# Patient Record
Sex: Male | Born: 1995 | Race: Asian | Hispanic: No | Marital: Single
Health system: Midwestern US, Community
[De-identification: ages and names within clinical notes are randomized; demographics above are authoritative.]

## PROBLEM LIST (undated history)

## (undated) DIAGNOSIS — Z0000 Encounter for general adult medical examination without abnormal findings: Secondary | ICD-10-CM

---
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
## 2014-03-19 ENCOUNTER — Emergency Department (HOSPITAL_COMMUNITY)
Admission: EM | Admit: 2014-03-19 | Discharge: 2014-03-19 | Discharge status: Absent without leave | Payer: Medicaid Other | Source: Home / Self Care

## 2014-03-19 ENCOUNTER — Emergency Department (HOSPITAL_COMMUNITY)
Admission: EM | Admit: 2014-03-19 | Discharge: 2014-03-19 | Disposition: A | Discharge status: Home / Self Care | Payer: Medicaid Other | Attending: Emergency Medicine | Admitting: Emergency Medicine

## 2014-03-19 ENCOUNTER — Encounter (HOSPITAL_COMMUNITY): Payer: Self-pay | Admitting: Emergency Medicine

## 2014-03-19 DIAGNOSIS — N342 Other urethritis: Secondary | ICD-10-CM | POA: Insufficient documentation

## 2014-03-19 LAB — URINALYSIS, ROUTINE W REFLEX MICROSCOPIC
Bilirubin Urine: NEGATIVE
Glucose, UA: NEGATIVE mg/dL
Hgb urine dipstick: NEGATIVE
KETONES UR: NEGATIVE mg/dL
LEUKOCYTES UA: NEGATIVE
NITRITE: NEGATIVE
Protein, ur: NEGATIVE mg/dL
SPECIFIC GRAVITY, URINE: 1.023 (ref 1.005–1.030)
UROBILINOGEN UA: 1 mg/dL (ref 0.0–1.0)
pH: 6.5 (ref 5.0–8.0)

## 2014-03-19 MED ORDER — TERBINAFINE HCL 1 % EX CREA: 1.0000 "application " | TOPICAL_CREAM | Freq: Two times a day (BID) | CUTANEOUS | Status: AC

## 2014-03-19 MED ORDER — CEFTRIAXONE SODIUM 1 G IJ SOLR
1.0000 g | Freq: Once | INTRAMUSCULAR | Status: AC
  Administered 2014-03-19: 1 g via INTRAMUSCULAR
  Filled 2014-03-19: qty 10

## 2014-03-19 MED ORDER — AZITHROMYCIN 250 MG PO TABS
1000.0000 mg | ORAL_TABLET | Freq: Once | ORAL | Status: AC
  Administered 2014-03-19: 1000 mg via ORAL
  Filled 2014-03-19: qty 4

## 2014-03-19 NOTE — Discharge Instructions (Signed)
Urethritis, Adult  Urethritis is an inflammation of the tube through which urine exits your bladder (urethra).   CAUSES  Urethritis is often caused by an infection in your urethra. The infection can be viral, like herpes. The infection can also be bacterial, like gonorrhea.  RISK FACTORS  Risk factors of urethritis include:  · Having sex without using a condom.  · Having multiple sexual partners.  · Having poor hygiene.  SIGNS AND SYMPTOMS  Symptoms of urethritis are less noticeable in women than in men. These symptoms include:  · Burning feeling when you urinate (dysuria).  · Discharge from your urethra.  · Blood in your urine (hematuria).  · Urinating more than usual.  DIAGNOSIS   To confirm a diagnosis of urethritis, your health care provider will do the following:  · Ask about your sexual history.  · Perform a physical exam.  · Have you provide a sample of your urine for lab testing.  · Use a cotton swab to gently collect a sample from your urethra for lab testing.  TREATMENT   It is important to treat urethritis. Depending on the cause, untreated urethritis may lead to serious genital infections and possibly infertility. Urethritis caused by a bacterial infection is treated with antibiotics. All sexual partners must be treated.   HOME CARE INSTRUCTIONS  · Do not have sex until the test results are known and treatment is completed, even if your symptoms go away before you finish treatment.  · Finish all medicines that you are prescribed.  SEEK MEDICAL CARE IF:   · Your symptoms are not improved in 3 days.  · Your symptoms are getting worse.  · You develop abdominal pain or pelvic pain (in women).  · You develop joint pain.  SEEK IMMEDIATE MEDICAL CARE IF:   · You have a fever with a temperature of 101.8°F (38.8°C) or greater.  · You have severe pain in the belly, back, or side.  · You have repeated vomiting.  Document Released: 05/24/2001 Document Revised: 09/18/2013 Document Reviewed: 07/29/2013  ExitCare®  Patient Information ©2014 [employer].

## 2014-03-19 NOTE — ED Notes (Signed)
Per pt and family sts about 2 years ago he was treated for urinary frequency with some medication and now it is back. sts has been going on for 1 month. Denies any pain. Denies N,V,D.

## 2014-03-19 NOTE — ED Notes (Signed)
CBG 123 

## 2014-03-19 NOTE — ED Provider Notes (Signed)
CSN: [id number]     Arrival date & time 03/19/14  1541 History  This chart was scribed for non-physician practitioner, Arthor CaptainAbigail Seeley Southgate, PA-C, working with Toy BakerAnthony T Allen, MD by Charline BillsEssence Howell, ED Scribe. This patient was seen in room TR05C/TR05C and the patient's care was started at 4:32 PM.    Chief Complaint  Patient presents with  . Urinary Frequency    The history is provided by the patient. No language interpreter was used.   HPI Comments: Nicholas Brown is a 18 y.o. male who presents to the Emergency Department complaining of constant urinary frequency onset 2 years ago. Pt states that the urinary frequency returned approximately 1 month ago. Pt states that he has the urge to urinate approximately every 2-3 minutes with small quantities. Pt also reports loss of appetite onset 3 days ago. Pt denies dysuria, penile discharge, and hematuria. He also denies any back and abdominal pain. Pt also denies fever, chills and vomit. Pt denies sexual intercourse. Pt's has no family history of kidney stones. Pt's father was diagnosed with diabetes 3 months ago.   History reviewed. No pertinent past medical history. History reviewed. No pertinent past surgical history. History reviewed. No pertinent family history. History  Substance Use Topics  . Smoking status: Never Smoker   . Smokeless tobacco: Not on file  . Alcohol Use: No    Review of Systems  Constitutional: Positive for appetite change (decreased). Negative for fever and chills.  Gastrointestinal: Negative for vomiting and abdominal pain.  Genitourinary: Positive for urgency and frequency. Negative for dysuria, hematuria and discharge.  Musculoskeletal: Negative for back pain.  All other systems reviewed and are negative.   Allergies  Review of patient's allergies indicates no known allergies.  Home Medications  No current outpatient prescriptions on file. Triage Vitals: BP 137/80  Pulse 92  Temp(Src) 98 F (36.7 C)  Resp 18  Wt  124 lb (56.246 kg)  SpO2 100% Physical Exam  Vitals reviewed. Constitutional: He is oriented to person, place, and time. He appears well-developed and well-nourished.  HENT:  Head: Normocephalic and atraumatic.  Eyes: EOM are normal. Pupils are equal, round, and reactive to light.  Neck: No JVD present. Carotid bruit is not present.  Cardiovascular: Normal rate, regular rhythm and normal heart sounds.   No murmur heard. Pulmonary/Chest: Effort normal and breath sounds normal. He has no rales.  Abdominal: Soft. There is no tenderness.  No CVA tenderness  Genitourinary: Uncircumcised. Penile erythema present.  Irritation and erythema around urethral meatus Tender to paplation No inguinal apathy, lesions or discharge Foreskin is easily retractable  Musculoskeletal: He exhibits no edema.  Neurological: He is alert and oriented to person, place, and time.  Skin: Skin is warm and dry.  Psychiatric: He has a normal mood and affect.    ED Course  Procedures (including critical care time) DIAGNOSTIC STUDIES: Oxygen Saturation is 100% on RA, normal by my interpretation.    COORDINATION OF CARE: 4:41 PM-Discussed treatment plan which includes UA with pt at bedside and pt agreed to plan.   Labs Review Labs Reviewed  GC/CHLAMYDIA PROBE AMP  URINALYSIS, ROUTINE W REFLEX MICROSCOPIC  CBG MONITORING, ED   Imaging Review No results found.   EKG Interpretation None      MDM   Final diagnoses:  Urethritis    Patient with urethritis. Will trat for g/c chlamydia. ? Yeast infection or other urethral irritation.  Advise topical lamisil.  F/u with pcp if unresolved. Marland Kitchen. UA unremarkable glucose  elevated.\\   I personally performed the services described in this documentation, which was scribed in my presence. The recorded information has been reviewed and is accurate.    Arthor Captain, PA-C 03/25/14 2257

## 2014-03-20 LAB — GC/CHLAMYDIA PROBE AMP
CT Probe RNA: NEGATIVE
GC PROBE AMP APTIMA: NEGATIVE

## 2014-03-21 LAB — CBG MONITORING, ED: GLUCOSE-CAPILLARY: 123 mg/dL — AB (ref 70–99)

## 2014-03-28 NOTE — ED Provider Notes (Signed)
Medical screening examination/treatment/procedure(s) were performed by non-physician practitioner and as supervising physician I was immediately available for consultation/collaboration.   EKG Interpretation None       Toy BakerAnthony T Kylena Mole, MD 03/28/14 1818

## 2014-11-14 ENCOUNTER — Emergency Department (HOSPITAL_COMMUNITY)
Admission: EM | Admit: 2014-11-14 | Discharge: 2014-11-14 | Disposition: A | Discharge status: Home / Self Care | Payer: Medicaid Other | Attending: Emergency Medicine | Admitting: Emergency Medicine

## 2014-11-14 ENCOUNTER — Encounter (HOSPITAL_COMMUNITY): Payer: Self-pay | Admitting: Emergency Medicine

## 2014-11-14 DIAGNOSIS — J029 Acute pharyngitis, unspecified: Secondary | ICD-10-CM | POA: Diagnosis not present

## 2014-11-14 DIAGNOSIS — Z79899 Other long term (current) drug therapy: Secondary | ICD-10-CM | POA: Diagnosis not present

## 2014-11-14 DIAGNOSIS — R509 Fever, unspecified: Secondary | ICD-10-CM | POA: Diagnosis present

## 2014-11-14 LAB — RAPID STREP SCREEN (MED CTR MEBANE ONLY): Streptococcus, Group A Screen (Direct): NEGATIVE

## 2014-11-14 MED ORDER — IBUPROFEN 400 MG PO TABS
600.0000 mg | ORAL_TABLET | Freq: Once | ORAL | Status: AC
  Administered 2014-11-14: 600 mg via ORAL

## 2014-11-14 MED ORDER — IBUPROFEN 400 MG PO TABS: 400.0000 mg | ORAL_TABLET | Freq: Once | ORAL | Status: DC

## 2014-11-14 NOTE — ED Notes (Signed)
Pt here by self. Pt has had sore throat and fever x2 days. No meds PTA.

## 2014-11-14 NOTE — Discharge Instructions (Signed)

## 2014-11-14 NOTE — ED Provider Notes (Signed)
CSN: [id number]     Arrival date & time 11/14/14  1341 History   First MD Initiated Contact with Patient 11/14/14 1352     Chief Complaint  Patient presents with  . Sore Throat  . Fever   18 yo male presents with 2 days of subjective fever, sore throat, and headache.  Did not take his temperature but reports he has felt warm.  Also with some mild cough.  No vomiting or diarrhea.  Mom gave him some pain pill for headache yesterday but he does not no which.  He denies any medical problems or drug allergies.  Works at the [employer].  (Consider location/radiation/quality/duration/timing/severity/associated sxs/prior Treatment) The history is provided by the patient.    No past medical history on file. History reviewed. No pertinent past surgical history. No family history on file. History  Substance Use Topics  . Smoking status: Never Smoker   . Smokeless tobacco: Not on file  . Alcohol Use: No    Review of Systems  Constitutional: Positive for fever. Negative for activity change and appetite change.  HENT: Positive for sore throat. Negative for congestion.   Respiratory: Positive for cough. Negative for shortness of breath and wheezing.   Cardiovascular: Negative for chest pain.  Gastrointestinal: Negative for nausea, vomiting and diarrhea.  Skin: Negative for rash.  Neurological: Positive for headaches. Negative for dizziness.  All other systems reviewed and are negative.     Allergies  Review of patient's allergies indicates no known allergies.  Home Medications   Prior to Admission medications   Medication Sig Start Date End Date Taking? Authorizing Provider  terbinafine (LAMISIL AT) 1 % cream Apply 1 application topically 2 (two) times daily. Apply to the penis 2 times a day 03/19/14   Arthor CaptainAbigail Harris, PA-C   BP 132/78 mmHg  Pulse 99  Temp(Src) 98.1 F (36.7 C) (Oral)  Resp 18  Wt 124 lb 12.5 oz (56.6 kg)  SpO2 100% Physical Exam  Constitutional: He is  oriented to person, place, and time. He appears well-developed and well-nourished.  HENT:  Head: Normocephalic and atraumatic.  Nose: Nose normal.  Mouth/Throat: Oropharynx is clear and moist. No oropharyngeal exudate.  Mildly erythematous oropharynx  Eyes: Conjunctivae and EOM are normal. Pupils are equal, round, and reactive to light. Right eye exhibits no discharge. Left eye exhibits no discharge.  Neck: Normal range of motion. Neck supple.  Cardiovascular: Normal rate, regular rhythm and normal heart sounds.  Exam reveals no gallop and no friction rub.   No murmur heard. Pulmonary/Chest: Effort normal and breath sounds normal. No respiratory distress. He has no wheezes.  Abdominal: Soft. He exhibits no distension. There is no tenderness.  Musculoskeletal: Normal range of motion. He exhibits no edema or tenderness.  Lymphadenopathy:    He has no cervical adenopathy.  Neurological: He is alert and oriented to person, place, and time.  Skin: Skin is warm. No rash noted.  Psychiatric: He has a normal mood and affect.    ED Course  Procedures (including critical care time) Labs Review Labs Reviewed  RAPID STREP SCREEN  CULTURE, GROUP A STREP    Imaging Review No results found.   EKG Interpretation None      MDM   Final diagnoses:  Pharyngitis    18 yo male with sore throat, subjective fever, and headache.  Afebrile on arrival and well appearing without meningeal signs.  Rapid strep negative.  D/C home with instructions for ibuprofen prn for sore throat/headache and  salt water gargles.  Saverio DankerSarah E. Rayven Rettig. MD PGY-3 [facility] 11/14/2014 2:46 PM      Saverio DankerSarah E Jamilya Sarrazin, MD 11/14/14 1446  Enid SkeensJoshua M Zavitz, MD 11/14/14 [phone]

## 2014-11-16 LAB — CULTURE, GROUP A STREP

## 2015-01-16 ENCOUNTER — Emergency Department (HOSPITAL_COMMUNITY)
Admission: EM | Admit: 2015-01-16 | Discharge: 2015-01-16 | Disposition: A | Discharge status: Home / Self Care | Payer: Medicaid Other | Attending: Emergency Medicine | Admitting: Emergency Medicine

## 2015-01-16 ENCOUNTER — Emergency Department (HOSPITAL_COMMUNITY): Payer: Medicaid Other

## 2015-01-16 ENCOUNTER — Encounter (HOSPITAL_COMMUNITY): Payer: Self-pay | Admitting: *Deleted

## 2015-01-16 DIAGNOSIS — J159 Unspecified bacterial pneumonia: Secondary | ICD-10-CM | POA: Insufficient documentation

## 2015-01-16 DIAGNOSIS — R05 Cough: Secondary | ICD-10-CM

## 2015-01-16 DIAGNOSIS — Z79899 Other long term (current) drug therapy: Secondary | ICD-10-CM | POA: Diagnosis not present

## 2015-01-16 DIAGNOSIS — R51 Headache: Secondary | ICD-10-CM | POA: Diagnosis present

## 2015-01-16 DIAGNOSIS — R000 Tachycardia, unspecified: Secondary | ICD-10-CM | POA: Diagnosis not present

## 2015-01-16 DIAGNOSIS — R059 Cough, unspecified: Secondary | ICD-10-CM

## 2015-01-16 DIAGNOSIS — J189 Pneumonia, unspecified organism: Secondary | ICD-10-CM

## 2015-01-16 LAB — RAPID STREP SCREEN (MED CTR MEBANE ONLY): STREPTOCOCCUS, GROUP A SCREEN (DIRECT): NEGATIVE

## 2015-01-16 MED ORDER — AZITHROMYCIN 250 MG PO TABS: 250.0000 mg | ORAL_TABLET | Freq: Every day | ORAL | Status: AC

## 2015-01-16 MED ORDER — ACETAMINOPHEN 325 MG PO TABS
650.0000 mg | ORAL_TABLET | Freq: Once | ORAL | Status: AC
  Administered 2015-01-16: 650 mg via ORAL
  Filled 2015-01-16: qty 2

## 2015-01-16 NOTE — ED Provider Notes (Addendum)
CSN: [id number]     Arrival date & time 01/16/15  1802 History   First MD Initiated Contact with Patient 01/16/15 2050     Chief Complaint  Patient presents with  . Headache     (Consider location/radiation/quality/duration/timing/severity/associated sxs/prior Treatment) Patient is a 19 y.o. male presenting with URI. The history is provided by the patient. The history is limited by a language barrier. A language interpreter was used.  URI Presenting symptoms: congestion, cough, fever and sore throat   Severity:  Moderate Onset quality:  Gradual Duration:  3 days Timing:  Constant Progression:  Worsening Chronicity:  New Relieved by:  Nothing Worsened by:  Nothing tried Ineffective treatments:  OTC medications Associated symptoms: headaches and myalgias   Associated symptoms: no wheezing   Associated symptoms comment:  Anterior neck pain Risk factors: sick contacts   Risk factors: no diabetes mellitus, no recent illness and no recent travel     History reviewed. No pertinent past medical history. History reviewed. No pertinent past surgical history. No family history on file. History  Substance Use Topics  . Smoking status: Never Smoker   . Smokeless tobacco: Not on file  . Alcohol Use: No    Review of Systems  Constitutional: Positive for fever.  HENT: Positive for congestion and sore throat.   Respiratory: Positive for cough. Negative for wheezing.   Musculoskeletal: Positive for myalgias.  Neurological: Positive for headaches.  All other systems reviewed and are negative.     Allergies  Review of patient's allergies indicates no known allergies.  Home Medications   Prior to Admission medications   Medication Sig Start Date End Date Taking? Authorizing Provider  terbinafine (LAMISIL AT) 1 % cream Apply 1 application topically 2 (two) times daily. Apply to the penis 2 times a day 03/19/14   Arthor Captain, PA-C   BP 115/66 mmHg  Pulse 108  Temp(Src) 98.2 F  (36.8 C)  Resp 16  Ht  (1.676 m)  SpO2 100% Physical Exam  Constitutional: He is oriented to person, place, and time. He appears well-developed and well-nourished. No distress.  HENT:  Head: Normocephalic and atraumatic.  Mouth/Throat: Mucous membranes are normal. Posterior oropharyngeal erythema present. No oropharyngeal exudate.  Eyes: Conjunctivae and EOM are normal. Pupils are equal, round, and reactive to light.  Neck: Trachea normal, normal range of motion and phonation normal. Neck supple. No Brudzinski's sign and no Kernig's sign noted.  Cardiovascular: Regular rhythm and intact distal pulses.  Tachycardia present.   No murmur heard. Pulmonary/Chest: Effort normal and breath sounds normal. No stridor. No respiratory distress. He has no wheezes. He has no rales.  Abdominal: Soft. He exhibits no distension. There is no tenderness. There is no rebound and no guarding.  Musculoskeletal: Normal range of motion. He exhibits no edema or tenderness.  Lymphadenopathy:    He has cervical adenopathy.  Neurological: He is alert and oriented to person, place, and time.  Skin: Skin is warm and dry. No rash noted. No erythema.  Psychiatric: He has a normal mood and affect. His behavior is normal.  Nursing note and vitals reviewed.   ED Course  Procedures (including critical care time) Labs Review Labs Reviewed  RAPID STREP SCREEN  CULTURE, GROUP A STREP  HIV ANTIBODY (ROUTINE TESTING)    Imaging Review Dg Chest 2 View  01/16/2015   CLINICAL DATA:  Fever, cough, headache, neck pain  EXAM: CHEST  2 VIEW  COMPARISON:  07/24/2009  FINDINGS: Mild patchy opacity in  the right middle and lower lobes, suspicious for pneumonia. Left lung is clear. No pleural effusion or pneumothorax.  The heart is normal in size.  Visualized osseous structures are within normal limits.  IMPRESSION: Right middle and right lower lobe pneumonia.   Electronically Signed   By: Charline BillsSriyesh  Krishnan M.D.   On:  01/16/2015 21:24     EKG Interpretation None      MDM   Final diagnoses:  Cough  CAP (community acquired pneumonia)    Pt with symptoms consistent with influenza.  Normal exam here and feels warm but is afebrile.  No signs of breathing difficulty  No signs of strep pharyngitis, otitis or abnormal abdominal findings.  No findings concerning for meningitis CXR consistent with pneumonia and will treat with abx however also still concerned for flu.  Strep neg.  Will have pt f/u with PCP.     Gwyneth SproutWhitney Danell Vazquez, MD 01/16/15 2147  Gwyneth SproutWhitney Levia Waltermire, MD 01/16/15 2242

## 2015-01-16 NOTE — ED Notes (Signed)
Pt  C/o a headache with neck pain   A cough and fever

## 2015-01-19 LAB — CULTURE, GROUP A STREP

## 2015-01-19 LAB — HIV ANTIBODY (ROUTINE TESTING W REFLEX): HIV Screen 4th Generation wRfx: NONREACTIVE

## 2015-11-13 IMAGING — CR DG CHEST 2V
2 series · 2 of 2 positions shown · non-contrast
Comparison: 07/24/2009

CLINICAL DATA: Fever, cough, headache, neck pain

EXAM:
CHEST  2 VIEW

[chest pa]
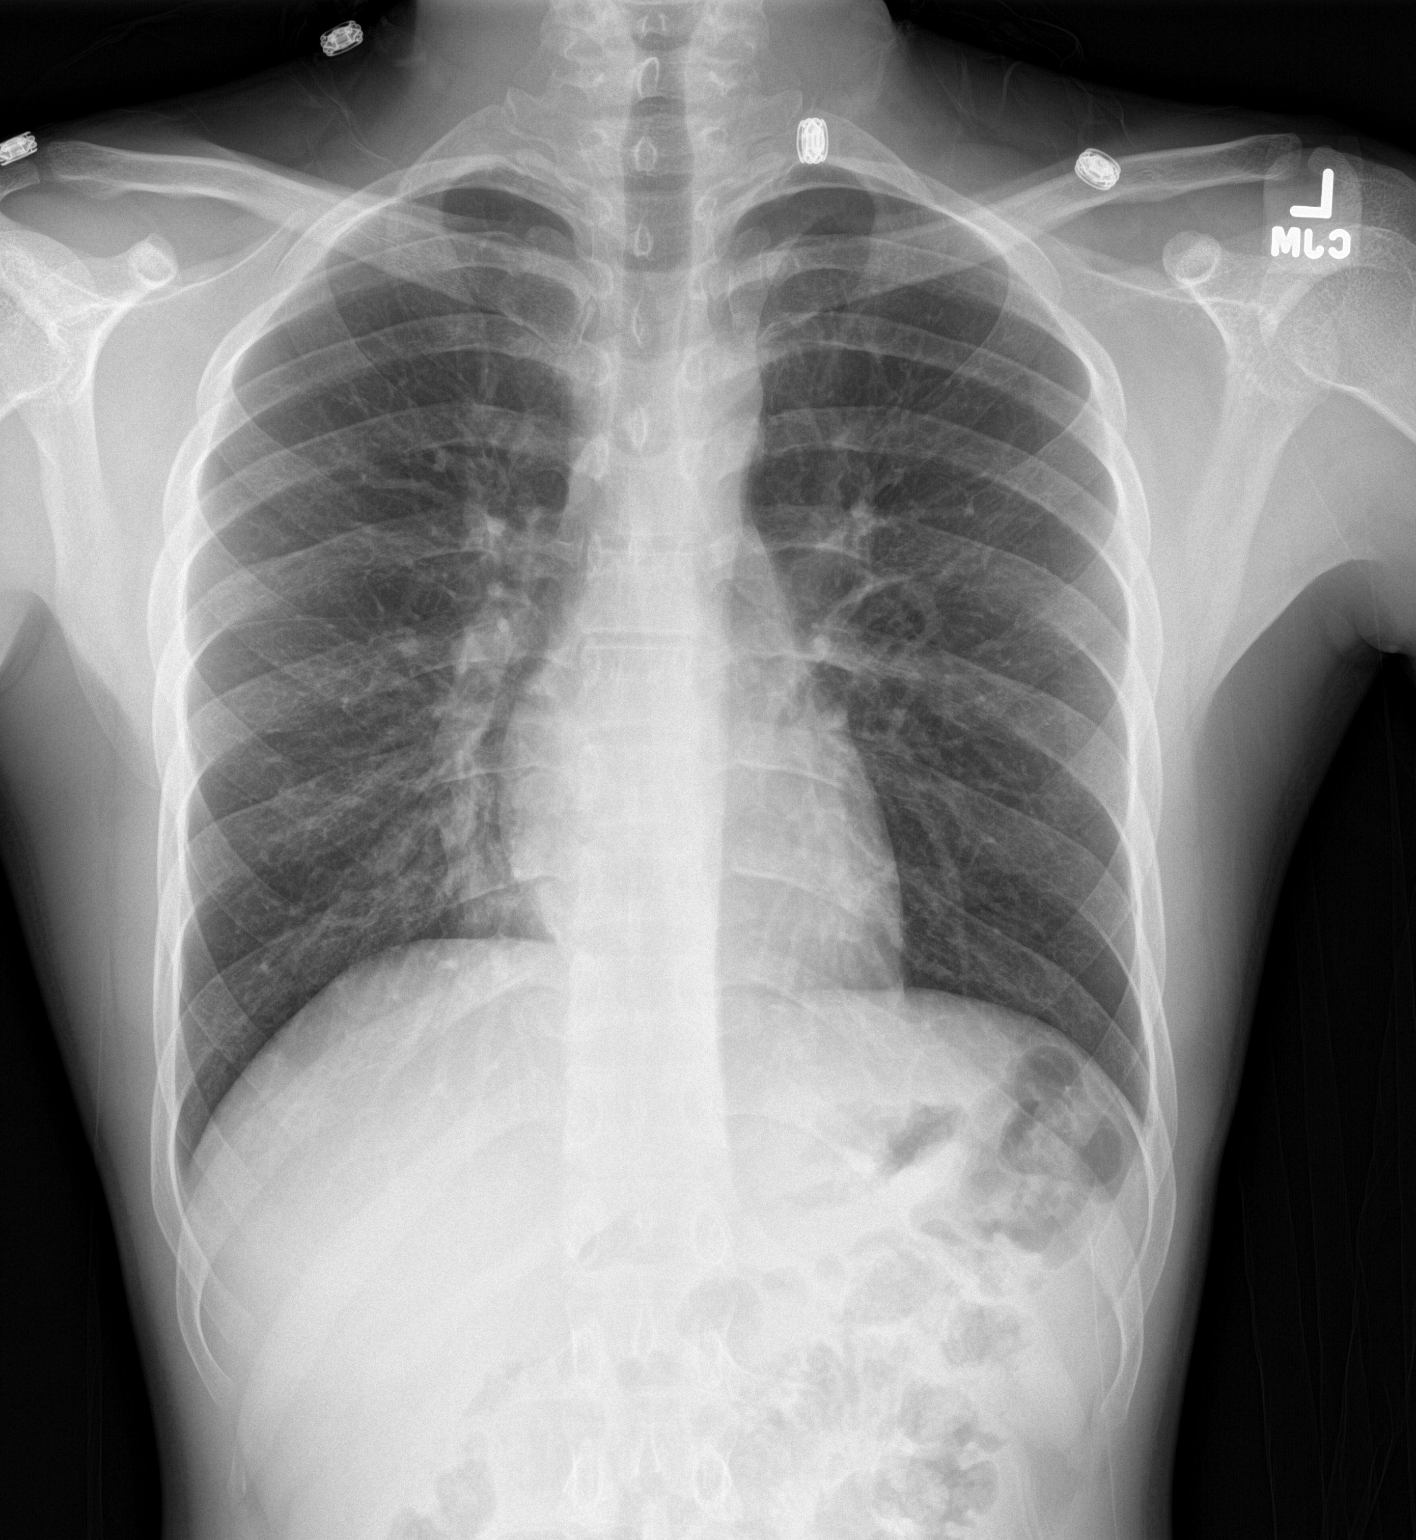

[chest lat]
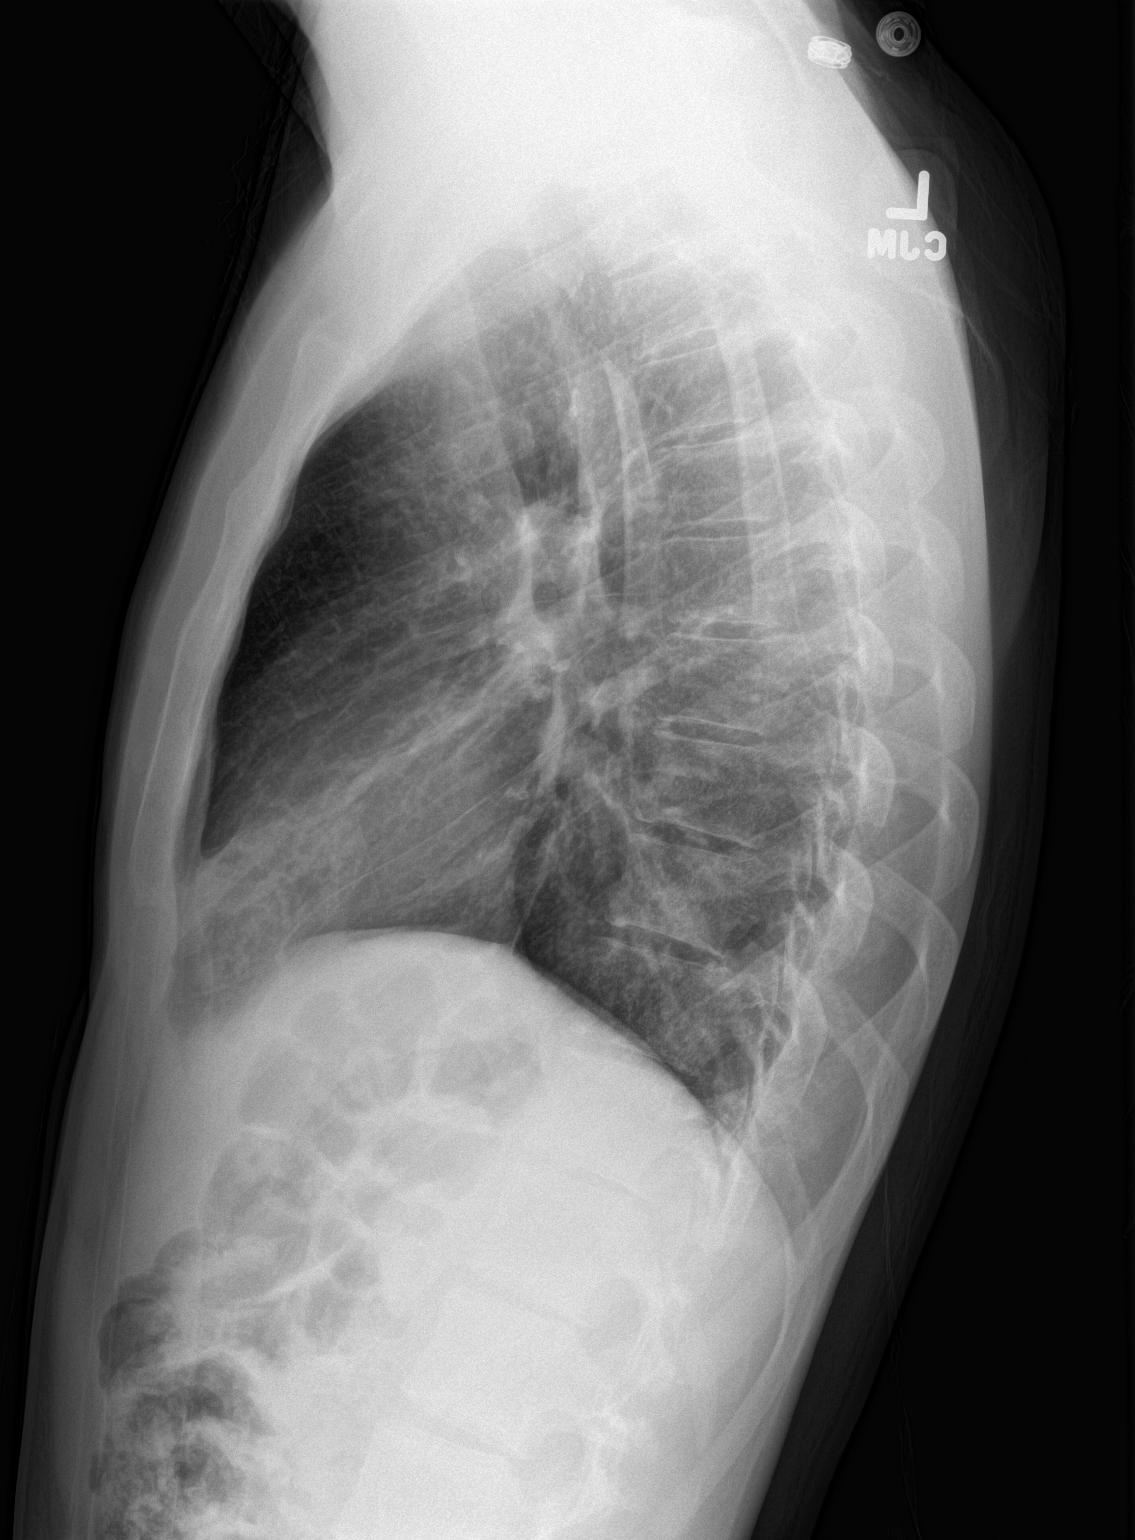

[2 of 2 positions shown; findings below may reference images not displayed]

FINDINGS: Mild patchy opacity in the right middle and lower lobes, suspicious
for pneumonia. Left lung is clear. No pleural effusion or
pneumothorax.

The heart is normal in size.

Visualized osseous structures are within normal limits.
IMPRESSION: Right middle and right lower lobe pneumonia.

## 2016-07-28 ENCOUNTER — Ambulatory Visit: Admit: 2016-07-28 | Discharge: 2016-07-28 | Payer: PRIVATE HEALTH INSURANCE | Attending: Internal Medicine

## 2016-07-28 DIAGNOSIS — I358 Other nonrheumatic aortic valve disorders: Secondary | ICD-10-CM

## 2016-07-28 NOTE — Patient Instructions (Addendum)
Heart murmur  Check echocardiogram     Smoking  Try Nicorette Gum (okay to buy generic)

## 2016-07-28 NOTE — Progress Notes (Signed)
Chief Complaint   Patient presents with   ??? Established New Doctor   ??? Headache     hasn't had one in over a month          History of Present Illness:  Chad Chang is a 20 y.o. male here to establish care.  He does not have any concerns today.  When living in [place] he suffered from headaches, but since moving to [place] in 2009 he has been okay.  He does smoke cigarettes, less than half a pack per day.  He may only spell 2 cigarettes a day.  He usually smokes his first cigarette 3 hours after waking up for the day.  He will quit for a week or 2 but then restart.  He tends to smoke when he is around his friends.      History:     Past Medical History:   Diagnosis Date   ??? Headache        No past surgical history on file.    Social History     Social History   ??? Marital status: Single     Spouse name: N/A   ??? Number of children: N/A   ??? Years of education: N/A     Occupational History   ??? Not on file.     Social History Main Topics   ??? Smoking status: Current Some Day Smoker   ??? Smokeless tobacco: Never Used   ??? Alcohol use No   ??? Drug use: No   ??? Sexual activity: No     Other Topics Concern   ??? Not on file     Social History Narrative   ??? No narrative on file       Family History   Problem Relation Age of Onset   ??? Migraines Mother    ??? Diabetes Father    ??? No Known Problems Sister    ??? No Known Problems Brother          Review of Systems:  Review of Systems   Constitutional: Negative for fatigue and fever.   HENT: Negative for ear pain, hearing loss, postnasal drip, rhinorrhea, sinus pressure, sore throat and tinnitus.    Eyes: Negative for redness.   Respiratory: Negative for cough, chest tightness, shortness of breath and wheezing.    Cardiovascular: Negative for chest pain, palpitations and leg swelling.   Gastrointestinal: Negative for abdominal pain, constipation, diarrhea, nausea and vomiting.   Genitourinary: Negative for dysuria and frequency.   Musculoskeletal: Negative for arthralgias, back pain and  joint swelling.   Skin: Negative for rash.   Neurological: Negative for dizziness, syncope and headaches.     Objective:    Vitals:    07/28/16 1407   BP: 108/60   Pulse: 102   SpO2: 99%   Weight: 128 lb (58.1 kg)   Height: 5\' 6"  (1.676 m)     Body mass index is 20.66 kg/(m^2).       Physical Exam   Constitutional: He appears well-developed and well-nourished. No distress.   HENT:   Head: Normocephalic and atraumatic.   Right Ear: Hearing, tympanic membrane, external ear and ear canal normal.   Left Ear: Hearing, tympanic membrane, external ear and ear canal normal.   Nose: Nose normal. No mucosal edema or rhinorrhea.   Mouth/Throat: Oropharynx is clear and moist and mucous membranes are normal.   Eyes: Pupils are equal, round, and reactive to light. No scleral icterus.   Neck:  No thyroid mass and no thyromegaly present.   Cardiovascular: Normal rate, regular rhythm, S1 normal and S2 normal.    Murmur heard.   Systolic (Right upper sternal border and at apex.  Does not radiate to carotid) murmur is present with a grade of 3/6   Pulmonary/Chest: Effort normal and breath sounds normal. He has no decreased breath sounds. He has no wheezes. He has no rhonchi. He has no rales.   Abdominal: Soft. Normal appearance and bowel sounds are normal. There is no tenderness.   Lymphadenopathy:     He has no cervical adenopathy.   Neurological: He is alert. No cranial nerve deficit or sensory deficit. Gait normal.   Skin: Skin is warm and dry. No rash noted.         Assessment and Plan    1. Aortic heart murmur  Patient with a high pitch blowing murmur at the right upper sternal border and apex.  He denies any symptoms, however the murmur does not sound benign.  Check echo  - ECHO Complete 2D W Doppler W Color; Future    2. Tobacco use disorder  Encourage smoking cessation.  He smokes a few cigarettes a day that I recommend a trial of gum  - Pneumococcal polysaccharide vaccine 23-valent >= 2yo subcutaneous/IM (PNEUMOVAX 23)    3.  Need for Tdap vaccination    - Tdap (age 1510y-64y) IM (ADACEL)    4. Need for pneumococcal vaccination    - Pneumococcal polysaccharide vaccine 23-valent >= 2yo subcutaneous/IM (PNEUMOVAX 23)         Return in about 4 weeks (around 08/25/2016) for Echocardiogram review.      Chad Chang

## 2016-08-05 ENCOUNTER — Encounter

## 2016-08-29 ENCOUNTER — Encounter: Attending: Internal Medicine

## 2016-08-29 NOTE — Telephone Encounter (Signed)
Called patient and left a message giving him Central Scheduling's phone number to schedule his Echo and then to reschedule his missed appointment with [place] a week after getting the Echo.

## 2017-01-02 ENCOUNTER — Encounter

## 2017-01-02 ENCOUNTER — Ambulatory Visit: Admit: 2017-01-02 | Discharge: 2017-01-02 | Payer: PRIVATE HEALTH INSURANCE | Attending: Internal Medicine

## 2017-01-02 DIAGNOSIS — Z0000 Encounter for general adult medical examination without abnormal findings: Secondary | ICD-10-CM

## 2017-01-02 MED ORDER — SALICYLIC ACID 17 % EX GEL
17 % | CUTANEOUS | 5 refills | Status: AC
Start: 2017-01-02 — End: ?

## 2017-01-02 NOTE — Progress Notes (Signed)
Chief Complaint   Patient presents with   ??? Annual Exam          Subjective:  Well Adult Physical: Patient here for a comprehensive physical exam.The patient reports no problems  Do you take any herbs or supplements that were not prescribed by a doctor? no Are you taking calcium supplements? no Are you taking aspirin daily? no  GU History:  Any STD's in the past? none, he has never been sexually active       History:     Past Medical History:   Diagnosis Date   ??? Headache        History reviewed. No pertinent surgical history.    Social History     Social History   ??? Marital status: Single     Spouse name: N/A   ??? Number of children: N/A   ??? Years of education: N/A     Occupational History   ??? Unemployed      Looking for work     Social History Main Topics   ??? Smoking status: Former Smoker     Start date: 12/02/2016   ??? Smokeless tobacco: Never Used   ??? Alcohol use No   ??? Drug use: No   ??? Sexual activity: No     Other Topics Concern   ??? Not on file     Social History Narrative    Patient born in [place], parents are from [place]. Came to [place] in 2009. Previously lived in [place]. [place]. Moved to [place] in 2016. Lives with my parents and brother.        Family History   Problem Relation Age of Onset   ??? Migraines Mother    ??? Diabetes Father    ??? No Known Problems Sister    ??? No Known Problems Brother        No Known Allergies     Review of Systems:    Review of Systems   Constitutional: Negative for fatigue and fever.   HENT: Negative for ear pain, hearing loss, postnasal drip, rhinorrhea, sinus pressure, sore throat and tinnitus.    Eyes: Negative for redness.   Respiratory: Negative for cough, chest tightness, shortness of breath and wheezing.    Cardiovascular: Negative for chest pain, palpitations and leg swelling.   Gastrointestinal: Negative for abdominal pain, constipation, diarrhea, nausea and vomiting.   Genitourinary: Negative for dysuria and frequency.   Musculoskeletal: Negative for arthralgias, back pain and  joint swelling.   Skin: Positive for rash.   Neurological: Negative for dizziness, syncope and headaches.       Objective:    Vitals:    01/02/17 1414   BP: 110/60   Pulse: 99   SpO2: 99%   Weight: 138 lb (62.6 kg)   Height: 5\' 7"  (1.702 m)     Wt Readings from Last 3 Encounters:   01/02/17 138 lb (62.6 kg)   07/28/16 128 lb (58.1 kg)       Body mass index is 21.61 kg/m??.       Physical Exam   Constitutional: He appears well-developed and well-nourished. No distress.   HENT:   Head: Normocephalic and atraumatic.   Right Ear: Hearing, tympanic membrane, external ear and ear canal normal.   Left Ear: Hearing, tympanic membrane, external ear and ear canal normal.   Nose: Nose normal. No mucosal edema or rhinorrhea.   Mouth/Throat: Oropharynx is clear and moist and mucous membranes are normal.   Eyes: Pupils  are equal, round, and reactive to light. No scleral icterus.   Neck: No thyroid mass and no thyromegaly present.   Cardiovascular: Normal rate, regular rhythm, S1 normal and S2 normal.    Murmur heard.   Systolic (high pitched that varies with respiration ) murmur is present with a grade of 3/6   Pulmonary/Chest: Effort normal and breath sounds normal. He has no decreased breath sounds. He has no wheezes. He has no rhonchi. He has no rales.   Abdominal: Soft. Normal appearance and bowel sounds are normal. There is no tenderness.   Lymphadenopathy:     He has no cervical adenopathy.   Neurological: He is alert. No cranial nerve deficit or sensory deficit. Gait normal.   Skin: Skin is warm and dry. No rash noted.             Assessment:    1. Well adult exam    - HIV Screen; Future  - Lipid Panel; Future    2. Screening for HIV (human immunodeficiency virus)    - HIV Screen; Future    3. Needs flu shot    - INFLUENZA, QUADV, 3 YRS AND OLDER, IM, PF, PREFILL SYR OR SDV, 0.5ML (FLUZONE QUADV, PF)    4. Aortic heart murmur      5. Tobacco use disorder      6. Screening, lipid    - Lipid Panel; Future    7. Other viral  warts    - salicylic acid 17 % gel; Apply topically daily.  Dispense: 1 Tube; Refill: 5         Plan/Patient Instructions:    Patient Instructions   Well exam   Check cholesterol and for HIV  Flu shot provided    Warts  Apply salicylic acid  It may take many months to go away    Heart murmur  Check echo       Return in about 4 weeks (around 01/30/2017) for ECHO follow up .       Jackie Russman L Braylyn Kalter

## 2017-01-02 NOTE — Patient Instructions (Addendum)
Well exam   Check cholesterol and for HIV  Flu shot provided    Warts  Apply salicylic acid  It may take many months to go away    Heart murmur  Check echo

## 2017-01-03 LAB — LIPID PANEL
Cholesterol, Total: 171 mg/dL (ref 0–199)
HDL: 74 mg/dL — ABNORMAL HIGH (ref 40–60)
LDL Calculated: 86 mg/dL (ref <100)
Triglycerides: 53 mg/dL (ref 0–150)
VLDL Cholesterol Calculated: 11 mg/dL

## 2017-01-03 LAB — HIV SCREEN
HIV ANTIGEN: NONREACTIVE
HIV Ag/Ab: NONREACTIVE
HIV-1 Antibody: NONREACTIVE
HIV-2 Ab: NONREACTIVE

## 2017-02-03 ENCOUNTER — Encounter: Payer: PRIVATE HEALTH INSURANCE | Attending: Internal Medicine

## 2017-02-07 ENCOUNTER — Inpatient Hospital Stay: Attending: Internal Medicine

## 2017-02-07 DIAGNOSIS — I358 Other nonrheumatic aortic valve disorders: Secondary | ICD-10-CM

## 2017-02-07 LAB — ECHOCARDIOGRAM COMPLETE 2D W DOPPLER W COLOR: Left Ventricular Ejection Fraction: 60

## 2017-02-13 ENCOUNTER — Ambulatory Visit: Admit: 2017-02-13 | Discharge: 2017-02-13 | Payer: PRIVATE HEALTH INSURANCE | Attending: Internal Medicine

## 2017-02-13 DIAGNOSIS — I071 Rheumatic tricuspid insufficiency: Secondary | ICD-10-CM

## 2017-02-13 MED ORDER — NICOTINE POLACRILEX 4 MG MT GUM
4 | OROMUCOSAL | 1 refills | Status: AC | PRN
Start: 2017-02-13 — End: ?

## 2017-02-13 NOTE — Patient Instructions (Addendum)
Smoking  Try nicotine gum  Chew one piece of gum every two hours when you feel the urge to smoke     You have trace tricuspid regurgitation which means one of your valves is slightly "leaky"  This should NOT cause you any problems.     Call [facility] for dental care - [phone]

## 2017-02-13 NOTE — Progress Notes (Signed)
[facility] Internal Medicine- Pediatrics      Patient Name: Chad Chang    Date of Birth:  [birth year]    Today's Date: 02/13/17           Chief Complaint   Patient presents with   . Results     ECHO          Subjective:  Heart Murmur  Patient here for ECHO results.  Denies chest pain, shortness of breath, lower extremity swelling    Smoking  Continues to smoke 2 cigarettes a day. Sometimes does not smoke at all.   Really wants to quit.          History:     Past Medical History:   Diagnosis Date   . Headache        Current Outpatient Prescriptions on File Prior to Visit   Medication Sig Dispense Refill   . salicylic acid 17 % gel Apply topically daily. 1 Tube 5     No current facility-administered medications on file prior to visit.        Review of Systems:    Review of Systems   Constitutional: Negative for fatigue and fever.   HENT: Negative for ear pain, hearing loss, postnasal drip, rhinorrhea, sinus pressure, sore throat and tinnitus.    Eyes: Negative for redness.   Respiratory: Negative for cough, chest tightness, shortness of breath and wheezing.    Cardiovascular: Negative for chest pain, palpitations and leg swelling.   Gastrointestinal: Negative for abdominal pain, constipation, diarrhea, nausea and vomiting.   Genitourinary: Negative for dysuria and frequency.   Musculoskeletal: Negative for arthralgias, back pain and joint swelling.   Skin: Negative for rash.   Neurological: Negative for dizziness, syncope and headaches.       Objective:    Vitals:    02/13/17 1528   BP: 112/60   Pulse: 115   SpO2: 98%   Weight: 140 lb (63.5 kg)     Wt Readings from Last 3 Encounters:   02/13/17 140 lb (63.5 kg)   01/02/17 138 lb (62.6 kg)   07/28/16 128 lb (58.1 kg)       Body mass index is 21.93 kg/m.       Physical Exam   Constitutional: He appears well-developed and well-nourished. No distress.   HENT:   Head: Normocephalic.   Mouth/Throat: Oropharynx is clear and moist. No oropharyngeal exudate.    Cardiovascular: Normal rate, regular rhythm and normal heart sounds.    Pulmonary/Chest: Effort normal and breath sounds normal.   Abdominal: Soft. He exhibits no distension. There is no tenderness.   Musculoskeletal: He exhibits no edema.   Skin: Skin is warm. No rash noted.         Assessment:    1. Trace tricuspid regurgitation by prior echocardiogram  Reassurance provided     2. Tobacco use disorder  Start Nicotine gum   - nicotine polacrilex (NICORETTE) 4 MG gum; Take 1 each by mouth as needed for Smoking cessation  Dispense: 120 each; Refill: 1         Plan/Patient Instructions:    Patient Instructions   Smoking  Try nicotine gum  Chew one piece of gum every two hours when you feel the urge to smoke     You have trace tricuspid regurgitation which means one of your valves is slightly "leaky"  This should NOT cause you any problems.     Call [facility] for dental care -  [phone]            Return in about 10 months (around 12/29/2017) for Well Exam .       Teresea Donley L [place]
